# Patient Record
Sex: Female | Born: 1985
Health system: Southern US, Community
[De-identification: ages and names within clinical notes are randomized; demographics above are authoritative.]

## PROBLEM LIST (undated history)

## (undated) DIAGNOSIS — E079 Disorder of thyroid, unspecified: Secondary | ICD-10-CM

---

## 2015-06-11 ENCOUNTER — Other Ambulatory Visit: Payer: Self-pay | Admitting: Obstetrics and Gynecology

## 2015-06-11 DIAGNOSIS — R1013 Epigastric pain: Secondary | ICD-10-CM

## 2015-06-12 ENCOUNTER — Ambulatory Visit
Admission: RE | Admit: 2015-06-12 | Discharge: 2015-06-12 | Disposition: A | Discharge status: Home / Self Care | Payer: 59 | Source: Ambulatory Visit | Attending: Obstetrics and Gynecology | Admitting: Obstetrics and Gynecology

## 2015-06-12 DIAGNOSIS — R1013 Epigastric pain: Secondary | ICD-10-CM

## 2016-11-30 IMAGING — US US ABDOMEN COMPLETE
1 series · 14 of 25 positions shown · non-contrast
Comparison: None.

CLINICAL DATA: Epigastric and right upper quadrant pain for 2-3
weeks.

EXAM:
ULTRASOUND ABDOMEN COMPLETE

[Series 1: us abdomen complete · 0.30mm/px · 14 of 68 slices shown]
[im 1/68]
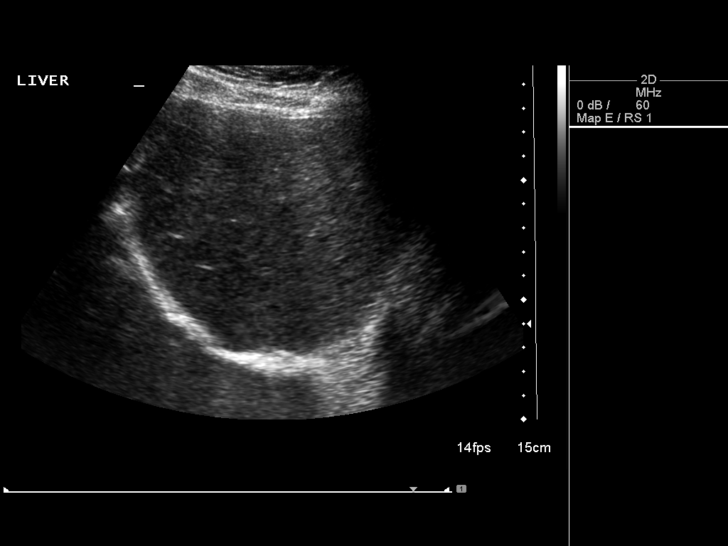
[im 6/68]
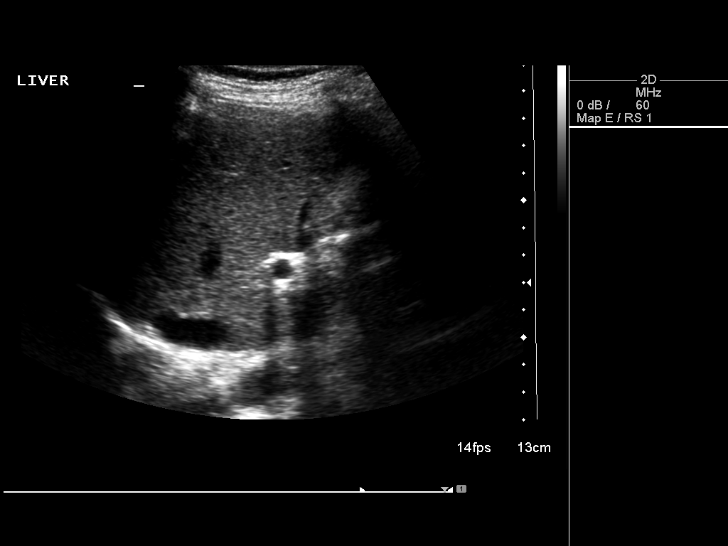
[im 12/68]
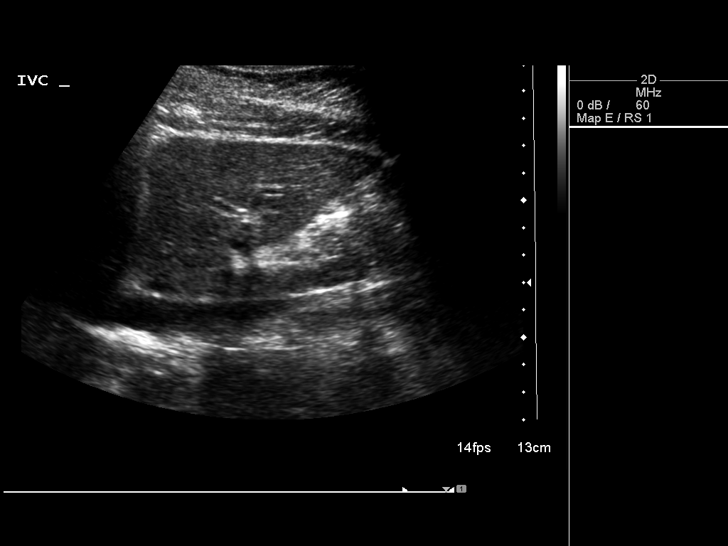
[im 17/68]
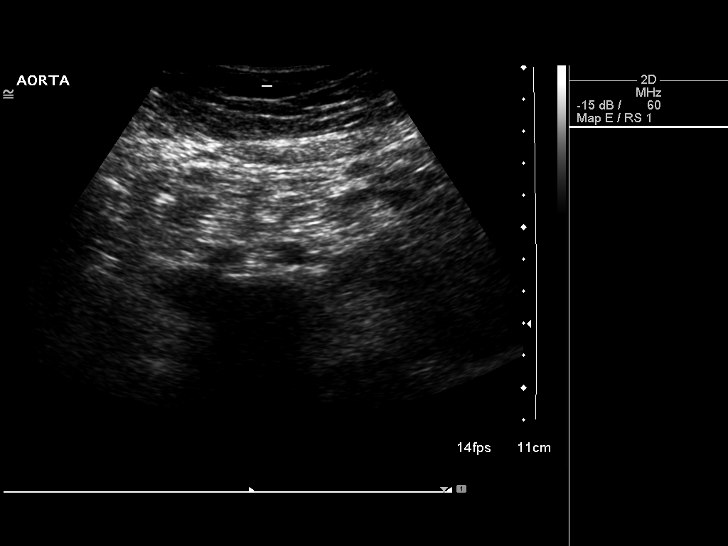
[im 23/68]
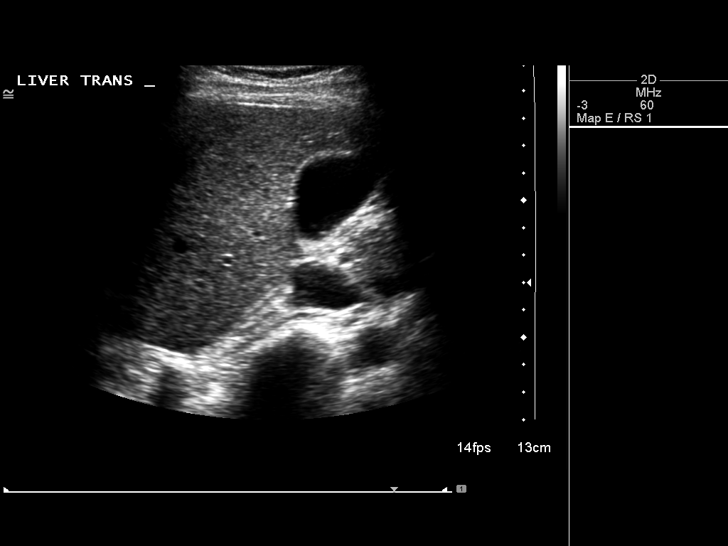
[im 26/68]
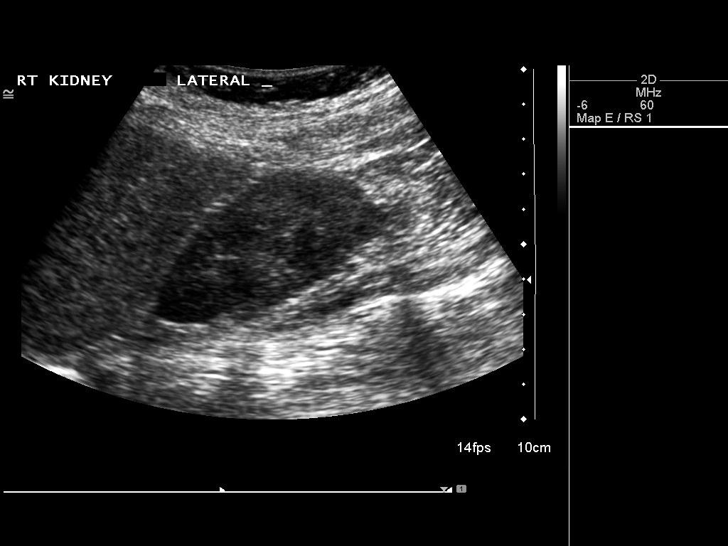
[im 31/68]
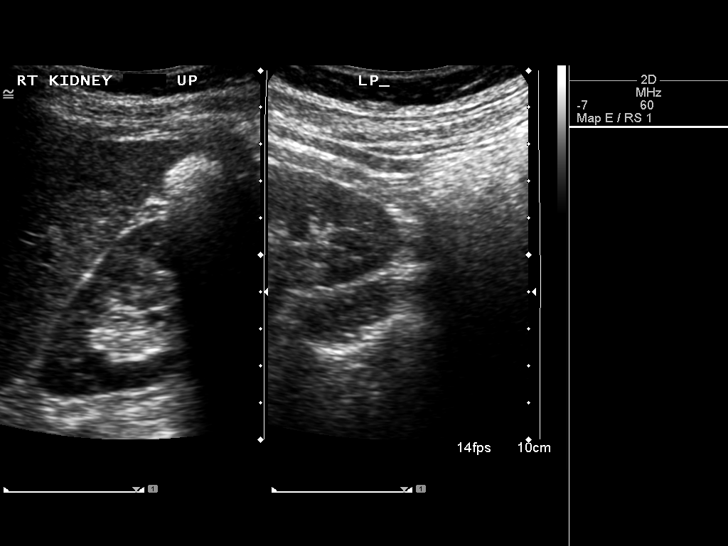
[im 37/68]
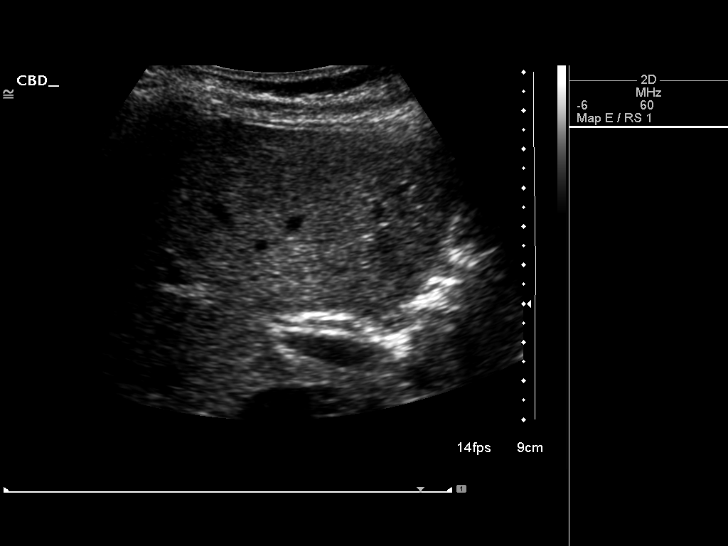
[im 42/68]
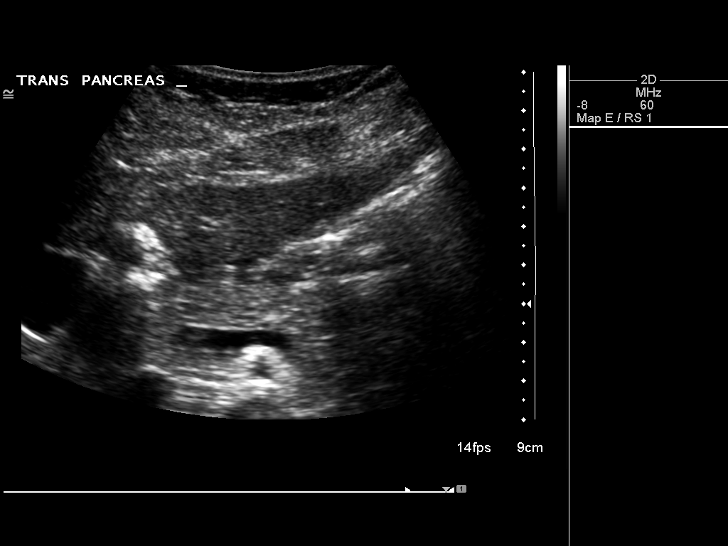
[im 45/68]
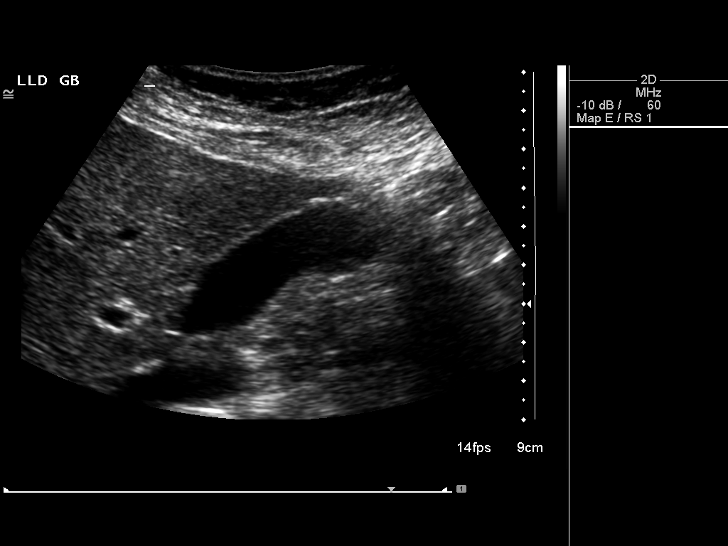
[im 51/68]
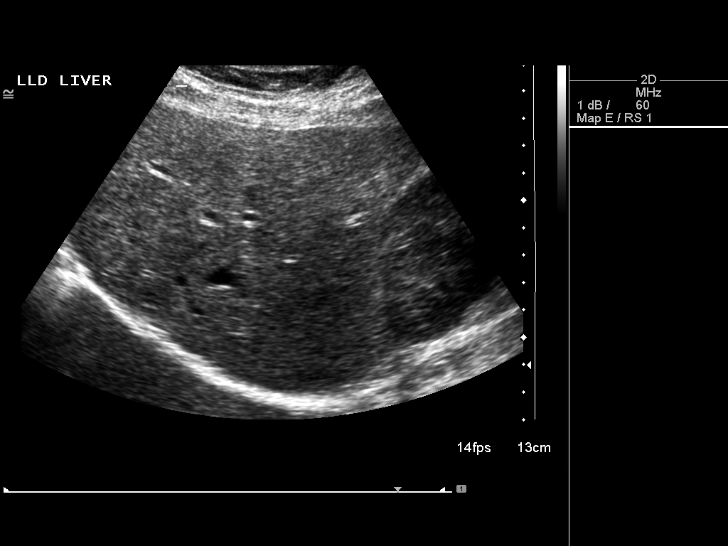
[im 56/68]
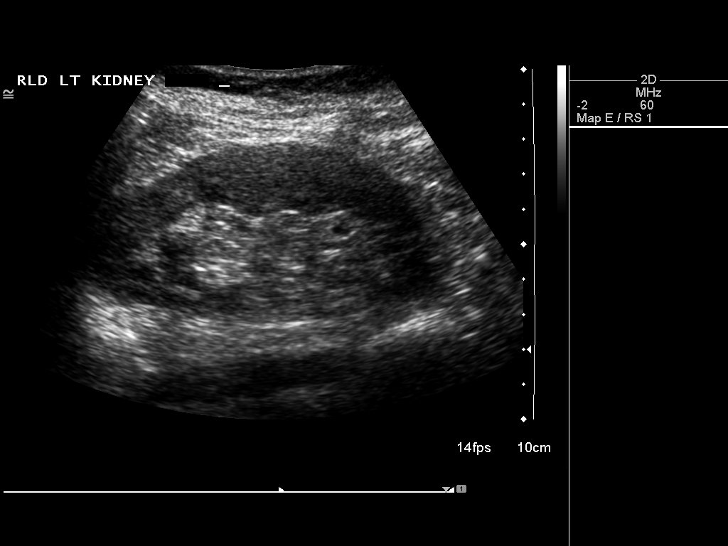
[im 62/68]
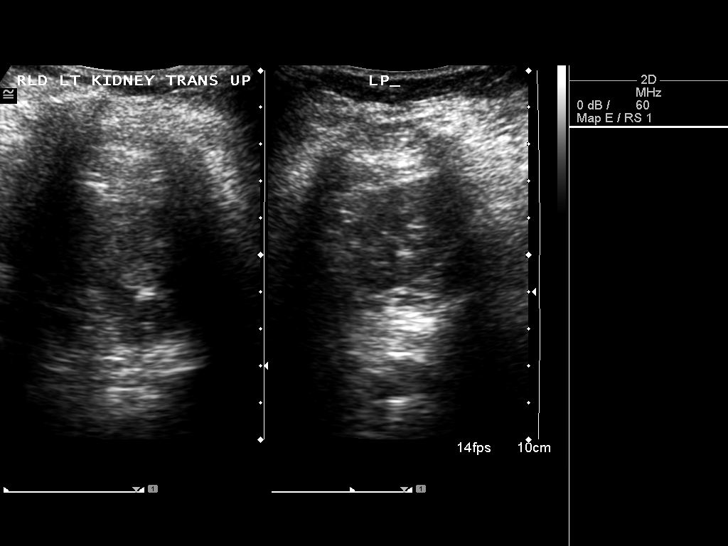
[im 68/68]
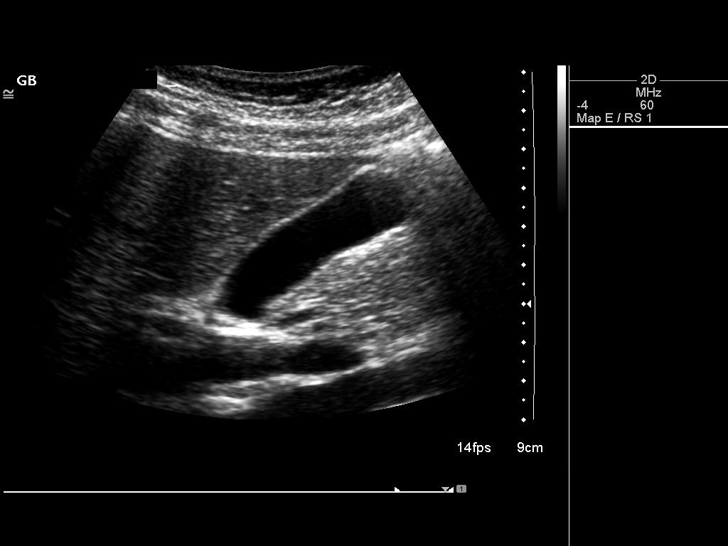

[14 of 25 positions shown; findings below may reference images not displayed]

FINDINGS: Gallbladder: No gallstones or wall thickening visualized. No
sonographic Murphy sign noted.

Common bile duct: Diameter: 2.0 mm

Liver: No focal lesion identified. Within normal limits in
parenchymal echogenicity.

IVC: No abnormality visualized.

Pancreas: Visualized portion unremarkable.

Spleen: Size and appearance within normal limits.

Right Kidney: Length: 10.4 cm. Echogenicity within normal limits. No
mass or hydronephrosis visualized.

Left Kidney: Length: 10.6 cm. Echogenicity within normal limits. No
mass or hydronephrosis visualized.

Abdominal aorta: No aneurysm visualized.

Other findings: None.
IMPRESSION: Normal abdominal ultrasound.

## 2016-12-28 ENCOUNTER — Encounter: Payer: Self-pay | Admitting: Emergency Medicine

## 2016-12-28 ENCOUNTER — Other Ambulatory Visit: Payer: Self-pay

## 2016-12-28 ENCOUNTER — Emergency Department
Admission: EM | Admit: 2016-12-28 | Discharge: 2016-12-28 | Disposition: A | Discharge status: Home / Self Care | Payer: 59 | Attending: Emergency Medicine | Admitting: Emergency Medicine

## 2016-12-28 ENCOUNTER — Emergency Department: Payer: 59

## 2016-12-28 DIAGNOSIS — R002 Palpitations: Secondary | ICD-10-CM | POA: Insufficient documentation

## 2016-12-28 DIAGNOSIS — R0789 Other chest pain: Secondary | ICD-10-CM | POA: Diagnosis not present

## 2016-12-28 DIAGNOSIS — E039 Hypothyroidism, unspecified: Secondary | ICD-10-CM | POA: Insufficient documentation

## 2016-12-28 HISTORY — DX: Disorder of thyroid, unspecified: E07.9

## 2016-12-28 LAB — URINALYSIS, COMPLETE (UACMP) WITH MICROSCOPIC
BACTERIA UA: NONE SEEN
Bilirubin Urine: NEGATIVE
GLUCOSE, UA: NEGATIVE mg/dL
Hgb urine dipstick: NEGATIVE
Ketones, ur: NEGATIVE mg/dL
Leukocytes, UA: NEGATIVE
Nitrite: NEGATIVE
PH: 7 (ref 5.0–8.0)
PROTEIN: NEGATIVE mg/dL
SQUAMOUS EPITHELIAL / LPF: NONE SEEN
Specific Gravity, Urine: 1.001 — ABNORMAL LOW (ref 1.005–1.030)
WBC, UA: NONE SEEN WBC/hpf (ref 0–5)

## 2016-12-28 LAB — CBC
HCT: 38 % (ref 35.0–47.0)
Hemoglobin: 13.4 g/dL (ref 12.0–16.0)
MCH: 30.8 pg (ref 26.0–34.0)
MCHC: 35.3 g/dL (ref 32.0–36.0)
MCV: 87.4 fL (ref 80.0–100.0)
PLATELETS: 273 10*3/uL (ref 150–440)
RBC: 4.34 MIL/uL (ref 3.80–5.20)
RDW: 12.7 % (ref 11.5–14.5)
WBC: 6.9 10*3/uL (ref 3.6–11.0)

## 2016-12-28 LAB — TROPONIN I

## 2016-12-28 LAB — BASIC METABOLIC PANEL
Anion gap: 4 — ABNORMAL LOW (ref 5–15)
BUN: 8 mg/dL (ref 6–20)
CALCIUM: 9.4 mg/dL (ref 8.9–10.3)
CO2: 29 mmol/L (ref 22–32)
CREATININE: 0.54 mg/dL (ref 0.44–1.00)
Chloride: 106 mmol/L (ref 101–111)
GFR calc Af Amer: >60 mL/min (ref >60)
GFR calc non Af Amer: >60 mL/min (ref >60)
GLUCOSE: 86 mg/dL (ref 65–99)
Potassium: 3.7 mmol/L (ref 3.5–5.1)
Sodium: 139 mmol/L (ref 135–145)

## 2016-12-28 LAB — TSH: TSH: 2.992 u[IU]/mL (ref 0.350–4.500)

## 2016-12-28 LAB — POCT PREGNANCY, URINE: Preg Test, Ur: NEGATIVE

## 2016-12-28 MED ORDER — LORAZEPAM 1 MG PO TABS
ORAL_TABLET | ORAL | Status: AC
  Administered 2016-12-28: 1 mg via ORAL
  Filled 2016-12-28: qty 1

## 2016-12-28 MED ORDER — LORAZEPAM 1 MG PO TABS: 1.0000 mg | ORAL_TABLET | Freq: Two times a day (BID) | ORAL | 0 refills | Status: AC | PRN

## 2016-12-28 MED ORDER — LORAZEPAM 1 MG PO TABS
1.0000 mg | ORAL_TABLET | Freq: Once | ORAL | Status: AC
  Administered 2016-12-28: 1 mg via ORAL

## 2016-12-28 MED ORDER — PANTOPRAZOLE SODIUM 20 MG PO TBEC: 20.0000 mg | DELAYED_RELEASE_TABLET | Freq: Every day | ORAL | 1 refills | Status: AC

## 2016-12-28 MED ORDER — CALCIUM CARBONATE ANTACID 500 MG PO CHEW
1.0000 | CHEWABLE_TABLET | Freq: Once | ORAL | Status: AC
  Administered 2016-12-28: 200 mg via ORAL
  Filled 2016-12-28: qty 1

## 2016-12-28 NOTE — ED Triage Notes (Signed)
Pt reports heart palpitations since last Friday that come and go . Pt reports SHOB denies dizziness. Pt states palpitations  lasted all day and all night last night. Reports pain in left shoulder that radiates under left breast. She also c/o constant burping since Friday. Denies vomiting or diarrhea denies fever.

## 2016-12-28 NOTE — ED Provider Notes (Signed)
Time Seen: Approximately 1422 I have reviewed the triage notes  Chief Complaint: Palpitations   History of Present Illness: Susan Walters is a 31 y.o. female he states that she's had some feelings of heart palpitations by hearing her heartbeat mostly at night. She states it pretty much goes on throughout the day now and started on Friday. Mild left-sided chest discomfort and a lot of burping and reflux symptoms that started on Friday as well. She denies any nausea, vomiting, fever. Denies any current chest discomfort at this time. She is also under treatment for hypothyroidism   Past Medical History:  Diagnosis Date  . Thyroid disease     There are no active problems to display for this patient.   History reviewed. No pertinent surgical history.  History reviewed. No pertinent surgical history.    Allergies:  Gaviscon [alum hydroxide-mag trisilicate]  Family History: No family history on file.  Social History: Social History  Substance Use Topics  . Smoking status: Never Smoker  . Smokeless tobacco: Never Used  . Alcohol use Not on file     Review of Systems:   10 point review of systems was performed and was otherwise negative:  Constitutional: No fever Eyes: No visual disturbances. No photophobia ENT: No sore throat, ear pain Cardiac: No persistent chest pain Respiratory: No shortness of breath, wheezing, or stridor Abdomen: No abdominal pain, no vomiting, No diarrhea Endocrine: No weight loss, No night sweats Extremities: No peripheral edema, cyanosis Skin: No rashes, easy bruising Neurologic: No focal weakness, trouble with speech or swollowing. No headaches Urologic: No dysuria, Hematuria, or urinary frequency   Physical Exam:  ED Triage Vitals [12/28/16 1041]  Enc Vitals Group     BP 122/77     Pulse Rate 82     Resp 16     Temp 98.6 F (37 C)     Temp Source Oral     SpO2 100 %     Weight 120 lb (54.4 kg)     Height 5\' 4"  (1.626 m)      Head Circumference      Peak Flow      Pain Score 2     Pain Loc      Pain Edu?      Excl. in GC?     General: Awake , Alert , and Oriented times 3; GCS 15 Head: Normal cephalic , atraumatic Eyes: Pupils equal , round, reactive to light Nose/Throat: No nasal drainage, patent upper airway without erythema or exudate.  Neck: Supple, Full range of motion, No anterior adenopathy or palpable thyroid masses Lungs: Clear to ascultation without wheezes , rhonchi, or rales Heart: Regular rate, regular rhythm With a grade 1-2 left sided systolic ejection murmur. No gallops or rubs Abdomen: Soft, non tender without rebound, guarding , or rigidity; bowel sounds positive and symmetric in all 4 quadrants. No organomegaly .        Extremities: 2 plus symmetric pulses. No edema, clubbing or cyanosis Neurologic: normal ambulation, Motor symmetric without deficits, sensory intact Skin: warm, dry, no rashes   Labs:   All laboratory work was reviewed including any pertinent negatives or positives listed below:  Labs Reviewed  BASIC METABOLIC PANEL - Abnormal; Notable for the following:       Result Value   Anion gap 4 (*)    All other components within normal limits  URINALYSIS, COMPLETE (UACMP) WITH MICROSCOPIC - Abnormal; Notable for the following:    Color, Urine  COLORLESS (*)    APPearance CLEAR (*)    Specific Gravity, Urine 1.001 (*)    All other components within normal limits  CBC  TROPONIN I  POC URINE PREG, ED  POCT PREGNANCY, URINE  Laboratory work was reviewed and showed no clinically significant abnormalities.   EKG: * ED ECG REPORT I, Jennye MoccasinBrian S Quigley, the attending physician, personally viewed and interpreted this ECG.  Date: 12/28/2016 EKG Time: 1056 Rate: 79 Rhythm: normal sinus rhythm QRS Axis: normal Intervals: normal ST/T Wave abnormalities: normal Conduction Disturbances: none Narrative Interpretation: unremarkable No acute ischemic changes   Radiology:   "Dg Chest 2 View  Result Date: 12/28/2016 CLINICAL DATA:  Non smoker with on going heart "palpatations" for 1 week. Known mitral valve prolapse EXAM: CHEST  2 VIEW COMPARISON:  None. FINDINGS: The heart size and mediastinal contours are within normal limits. Both lungs are clear. No pleural effusion or pneumothorax. The visualized skeletal structures are unremarkable. IMPRESSION: No active cardiopulmonary disease. Electronically Signed   By: Amie Portlandavid  Ormond M.D.   On: 12/28/2016 11:48  "  I personally reviewed the radiologic studies   ED Course:  Patient was placed on a continuous cardiac monitor remained in normal sinus rhythm with no ectopy. No evidence of cardiac arrhythmias etc. Patient received Ativan by mouth 1 and felt symptomatically improved. She was also given some Tums for esophageal reflux disease. We will prescribe Protonix for outpatient management and the patient was advised follow-up with cardiology along with her primary physician for further assessment.     Assessment: Heart palpitations Esophageal reflux disease      Plan:  Outpatient " New Prescriptions   LORAZEPAM (ATIVAN) 1 MG TABLET    Take 1 tablet (1 mg total) by mouth 2 (two) times daily as needed for anxiety.   PANTOPRAZOLE (PROTONIX) 20 MG TABLET    Take 1 tablet (20 mg total) by mouth daily.  " Patient was advised to return immediately if condition worsens. Patient was advised to follow up with their primary care physician or other specialized physicians involved in their outpatient care. The patient and/or family member/power of attorney had laboratory results reviewed at the bedside. All questions and concerns were addressed and appropriate discharge instructions were distributed by the nursing staff.             Jennye MoccasinBrian S Quigley, MD 12/28/16 [phone]

## 2016-12-28 NOTE — ED Notes (Signed)
Pt alert and oriented X4, active, cooperative, pt in NAD. RR even and unlabored, color WNL.  Pt informed to return if any life threatening symptoms occur.   

## 2016-12-28 NOTE — ED Notes (Addendum)
Pt reports she has been hearing her heart beat and having palpitations since friday. Pt states she is also being treated for hypothyroidism. Denies any chest pain. States when she hears her heart beating in her head it makes her more anxious. Patient also c/o acid reflux and frequent burping even when eating a small snack.

## 2016-12-28 NOTE — Discharge Instructions (Signed)
Please contact the above cardiologist for further outpatient assessment with possible echocardiogram. Please drink plenty of fluids. He can continue with over-the-counter toms for esophageal reflux disease. Please return to the emergency Department for persistent chest pain, shortness of breath, passed out or feel significantly lightheaded, or any other new concerns.  Please return immediately if condition worsens. Please contact her primary physician or the physician you were given for referral. If you have any specialist physicians involved in her treatment and plan please also contact them. Thank you for using [facility].

## 2018-06-18 IMAGING — CR DG CHEST 2V
1 series · 2 of 2 positions shown · non-contrast
Comparison: None.

CLINICAL DATA: Non smoker with on going heart "palpatations" for 1
week. Known mitral valve prolapse

EXAM:
CHEST  2 VIEW

[Series 1: dg chest 2 view · 0.14mm/px · 2 of 2 slices shown]
[im 1/2]
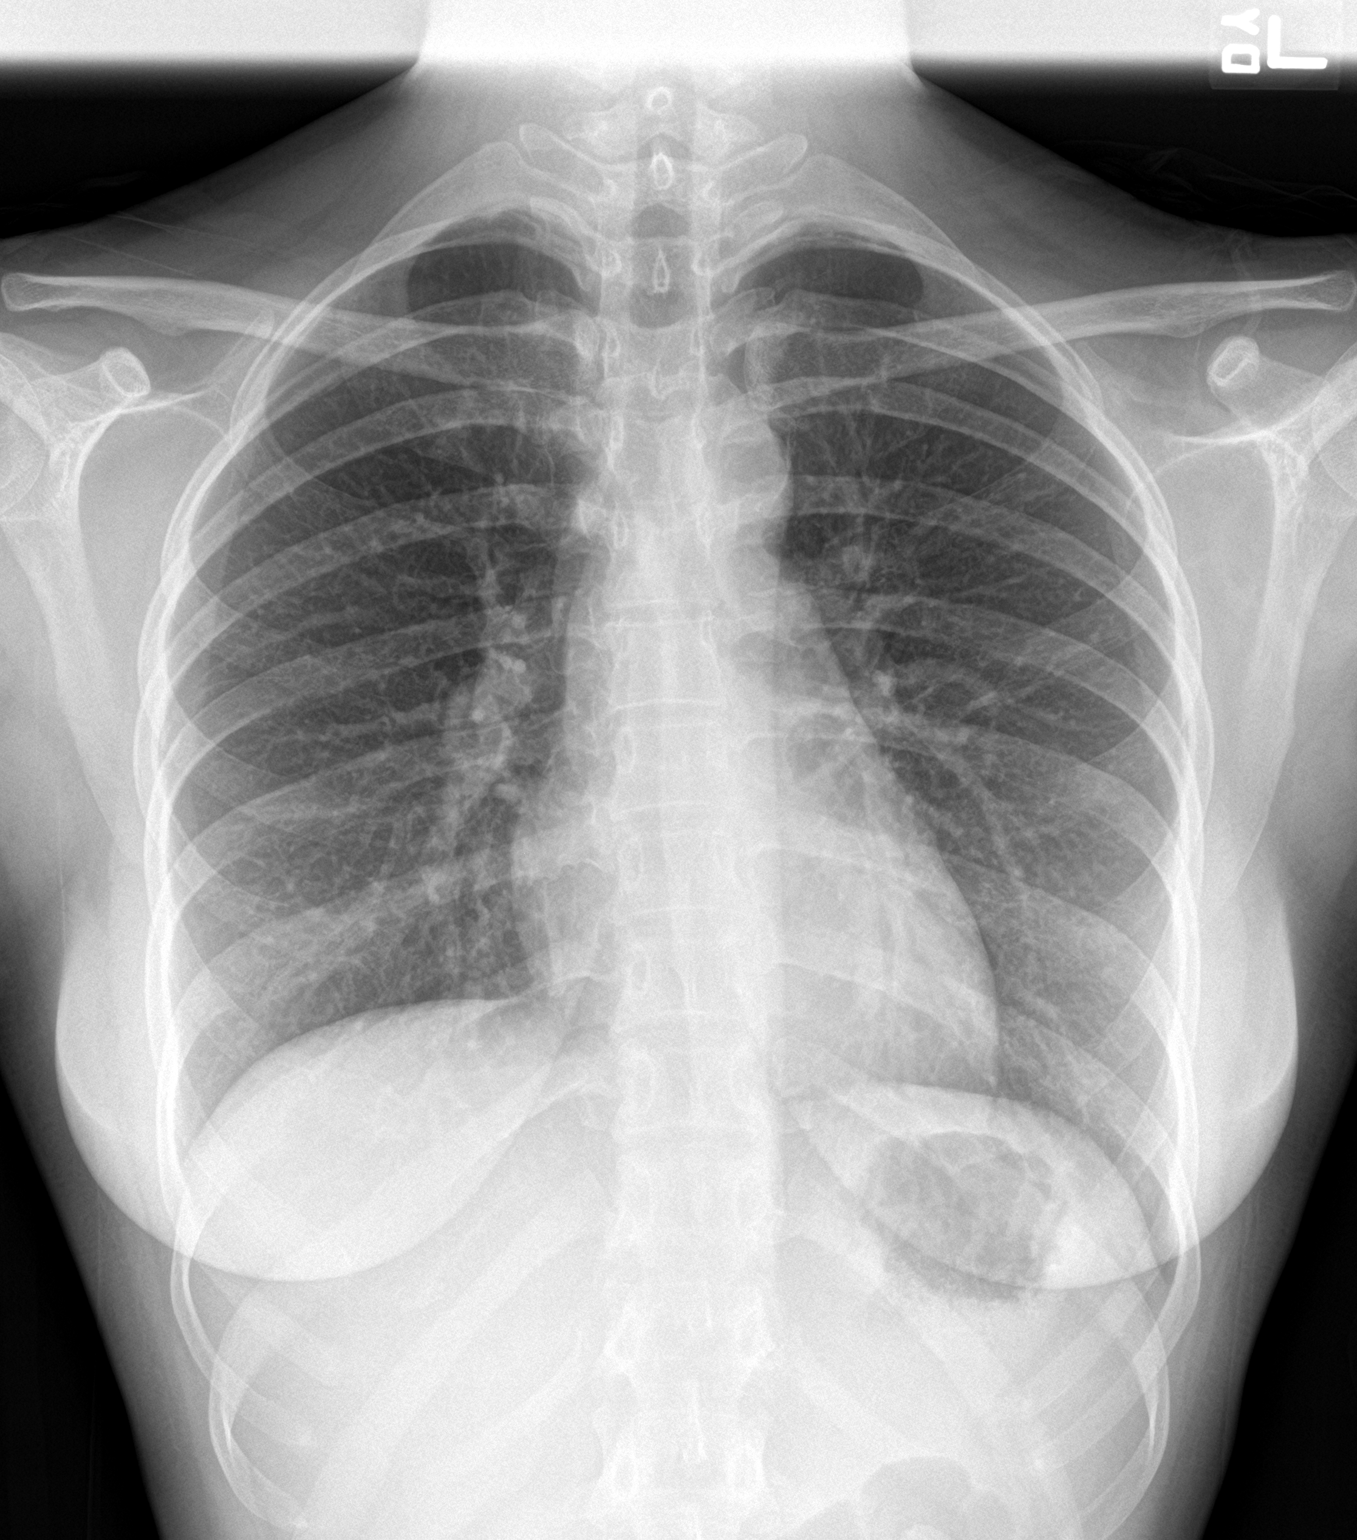
[im 2/2]
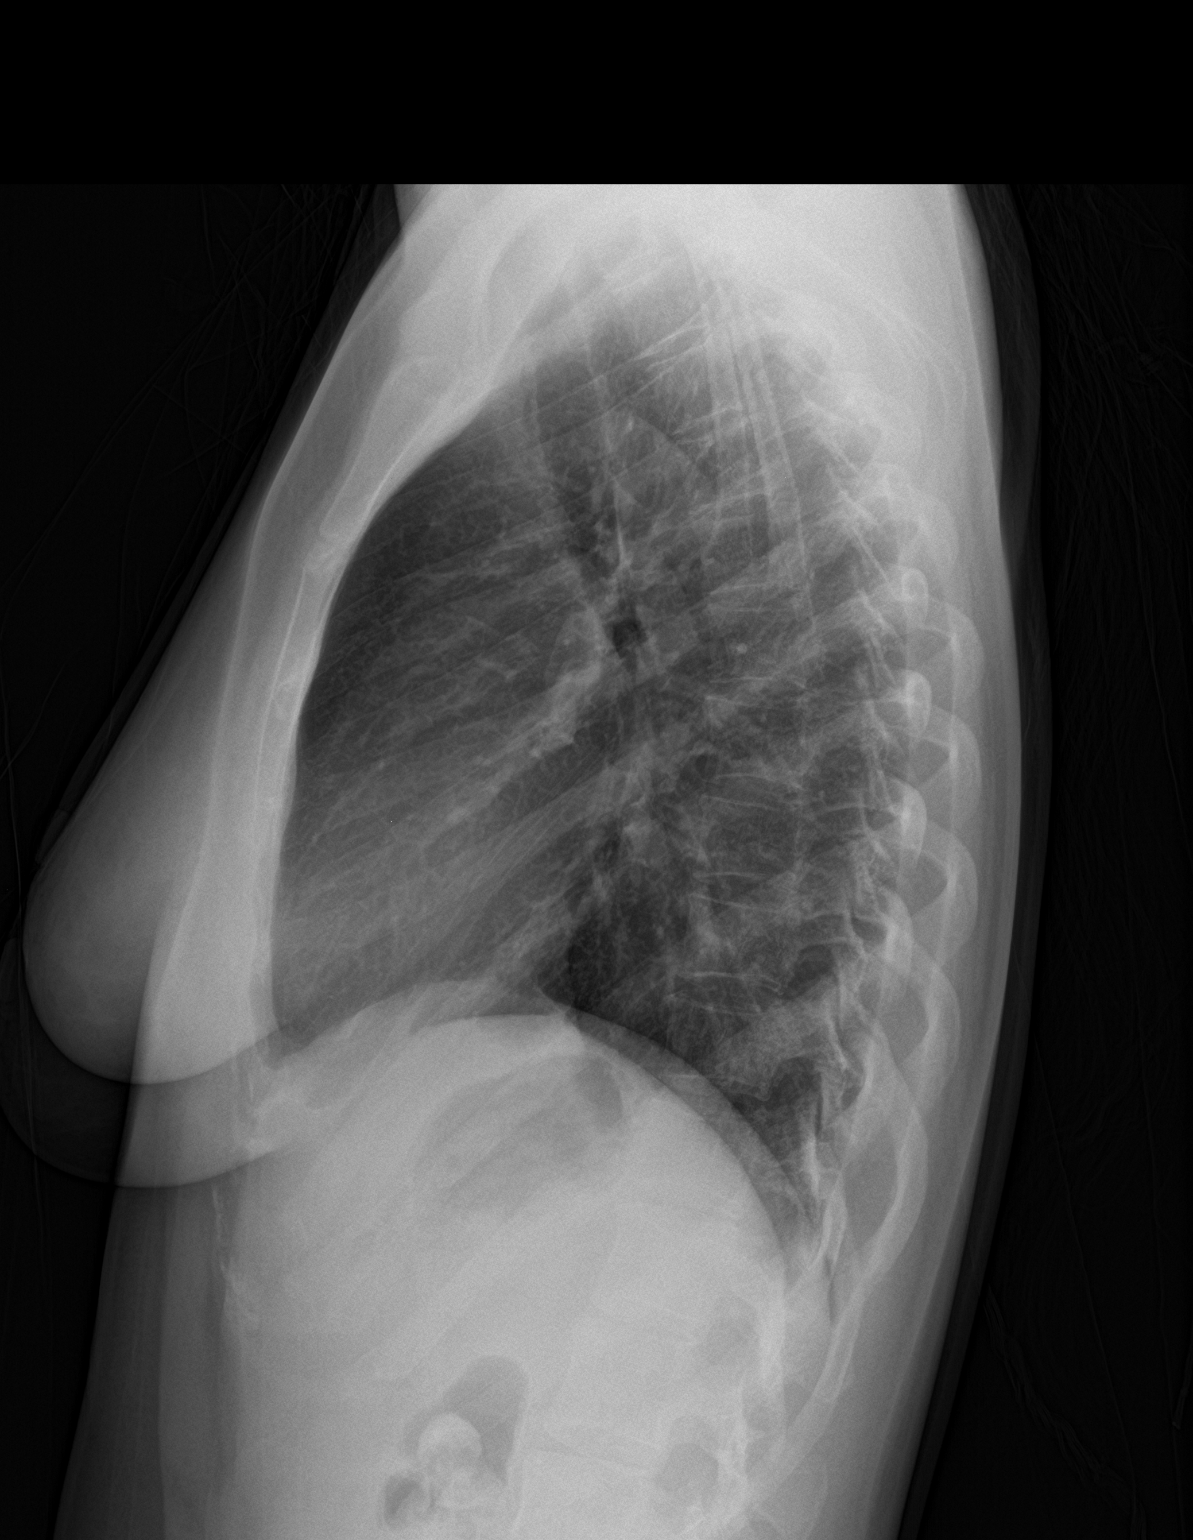

[2 of 2 positions shown; findings below may reference images not displayed]

FINDINGS: The heart size and mediastinal contours are within normal limits.
Both lungs are clear. No pleural effusion or pneumothorax. The
visualized skeletal structures are unremarkable.
IMPRESSION: No active cardiopulmonary disease.
